# Patient Record
Sex: Female | Born: 1995 | Race: White | Hispanic: No | Marital: Single | Smoking: Never smoker
Health system: Southern US, Community
[De-identification: ages and names within clinical notes are randomized; demographics above are authoritative.]

## PROBLEM LIST (undated history)

## (undated) HISTORY — PX: HIP SURGERY: SHX245

---
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
## 2016-05-07 ENCOUNTER — Ambulatory Visit (INDEPENDENT_AMBULATORY_CARE_PROVIDER_SITE_OTHER): Payer: 59 | Admitting: Family Medicine

## 2016-05-07 ENCOUNTER — Encounter: Payer: Self-pay | Admitting: Family Medicine

## 2016-05-07 DIAGNOSIS — M25552 Pain in left hip: Secondary | ICD-10-CM | POA: Diagnosis not present

## 2016-05-07 MED ORDER — NAPROXEN 500 MG PO TABS
500.0000 mg | ORAL_TABLET | Freq: Two times a day (BID) | ORAL | 0 refills | Status: AC
Start: 2016-05-07 — End: ?

## 2016-05-07 NOTE — Progress Notes (Signed)
Patient presents today with left hip pain that she's had for a few weeks. Patient denies any particular injury or trauma to the hip. Patient did have hip surgery for a labral tear last year. Patient states that she was cleared for athletic activity last December. She did have some soreness when she returned back to physical activity but not similar to what she experienced before her surgery. She states the symptoms that she is having now are similar to what she had prior to her hip surgery. Patient states that she is having deep left hip pain with popping at times. She does feel the pain at rest sometimes. She did not been taking medications for his symptoms. She can walk without any significant pain.  ROS: Negative except mentioned above. Vitals as per Epic. GENERAL: NAD RESP: CTA B CARD: RRR MSK: No obvious tenderness to palpation of hip or back, full range of motion of both hips, mild discomfort with internal rotation of the left hip, no significant tightness of hip flexors, week glutes, negative straight leg raise, negative Fulcrum, NV intact NEURO: CN II-XII grossly intact   A/P: Left hip pain with history of labral tear status post surgery last year - discussed with patient that would recommend taking NSAID and modifying activity as needed. There are likely only 1-2 games left in the lacrosse season. If she has worsening pain she will follow-up with me and be taken out of activity. Will do imaging at that time if needed. I have recommended that if she continues to have discomfort with activity over summer break she should follow-up with her surgeon at home. Mother was on the phone during our appointment. Parent and patient are comfortable with the plan.Will discuss plan with trainer as well.

## 2017-11-01 ENCOUNTER — Other Ambulatory Visit: Payer: Self-pay

## 2017-11-01 ENCOUNTER — Encounter: Payer: Self-pay | Admitting: Emergency Medicine

## 2017-11-01 ENCOUNTER — Emergency Department
Admission: EM | Admit: 2017-11-01 | Discharge: 2017-11-01 | Disposition: A | Discharge status: Home / Self Care | Payer: Managed Care, Other (non HMO) | Attending: Emergency Medicine | Admitting: Emergency Medicine

## 2017-11-01 ENCOUNTER — Emergency Department: Payer: Managed Care, Other (non HMO)

## 2017-11-01 DIAGNOSIS — Z79899 Other long term (current) drug therapy: Secondary | ICD-10-CM | POA: Insufficient documentation

## 2017-11-01 DIAGNOSIS — R1031 Right lower quadrant pain: Secondary | ICD-10-CM | POA: Diagnosis present

## 2017-11-01 LAB — COMPREHENSIVE METABOLIC PANEL
ALT: 28 U/L (ref 0–44)
ANION GAP: 10 (ref 5–15)
AST: 33 U/L (ref 15–41)
Albumin: 4 g/dL (ref 3.5–5.0)
Alkaline Phosphatase: 36 U/L — ABNORMAL LOW (ref 38–126)
BUN: 14 mg/dL (ref 6–20)
CALCIUM: 9.3 mg/dL (ref 8.9–10.3)
CHLORIDE: 106 mmol/L (ref 98–111)
CO2: 23 mmol/L (ref 22–32)
CREATININE: 0.74 mg/dL (ref 0.44–1.00)
GFR calc non Af Amer: >60 mL/min (ref >60)
Glucose, Bld: 99 mg/dL (ref 70–99)
Potassium: 4.1 mmol/L (ref 3.5–5.1)
SODIUM: 139 mmol/L (ref 135–145)
Total Bilirubin: 0.6 mg/dL (ref 0.3–1.2)
Total Protein: 7.3 g/dL (ref 6.5–8.1)

## 2017-11-01 LAB — CBC
HEMATOCRIT: 40.9 % (ref 36.0–46.0)
Hemoglobin: 13.8 g/dL (ref 12.0–15.0)
MCH: 30.9 pg (ref 26.0–34.0)
MCHC: 33.7 g/dL (ref 30.0–36.0)
MCV: 91.5 fL (ref 80.0–100.0)
NRBC: 0 % (ref 0.0–0.2)
PLATELETS: 335 10*3/uL (ref 150–400)
RBC: 4.47 MIL/uL (ref 3.87–5.11)
RDW: 12.2 % (ref 11.5–15.5)
WBC: 7.5 10*3/uL (ref 4.0–10.5)

## 2017-11-01 LAB — LIPASE, BLOOD: Lipase: 22 U/L (ref 11–51)

## 2017-11-01 LAB — POCT PREGNANCY, URINE: Preg Test, Ur: NEGATIVE

## 2017-11-01 MED ORDER — ONDANSETRON 4 MG PO TBDP: 4.0000 mg | ORAL_TABLET | Freq: Three times a day (TID) | ORAL | 0 refills | Status: AC | PRN

## 2017-11-01 MED ORDER — IBUPROFEN 200 MG PO TABS: 600.0000 mg | ORAL_TABLET | Freq: Four times a day (QID) | ORAL | 0 refills | Status: AC | PRN

## 2017-11-01 MED ORDER — KETOROLAC TROMETHAMINE 30 MG/ML IJ SOLN
15.0000 mg | INTRAMUSCULAR | Status: AC
  Administered 2017-11-01: 15 mg via INTRAVENOUS
  Filled 2017-11-01: qty 1

## 2017-11-01 MED ORDER — IOPAMIDOL (ISOVUE-300) INJECTION 61%
100.0000 mL | Freq: Once | INTRAVENOUS | Status: AC | PRN
  Administered 2017-11-01: 100 mL via INTRAVENOUS

## 2017-11-01 MED ORDER — ONDANSETRON HCL 4 MG/2ML IJ SOLN
4.0000 mg | Freq: Once | INTRAMUSCULAR | Status: AC
  Administered 2017-11-01: 4 mg via INTRAVENOUS
  Filled 2017-11-01: qty 2

## 2017-11-01 MED ORDER — SODIUM CHLORIDE 0.9 % IV BOLUS
1000.0000 mL | Freq: Once | INTRAVENOUS | Status: AC
  Administered 2017-11-01: 1000 mL via INTRAVENOUS

## 2017-11-01 NOTE — ED Triage Notes (Signed)
Pt c/o right lower quadrant pain over the weekend, N,V,D since Sunday, appears in NAD. Possible fever last night.

## 2017-11-01 NOTE — ED Triage Notes (Signed)
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

## 2017-11-01 NOTE — ED Notes (Signed)
Patient transported to CT 

## 2017-11-01 NOTE — Discharge Instructions (Signed)
Your labs and CT scan of the abdomen were normal today.  Take anti-inflammatory medicine for pain, Zofran for nausea control, stay well-hydrated, and follow-up with your doctor later this week to continue monitoring your symptoms.

## 2017-11-01 NOTE — ED Provider Notes (Signed)
[facility] Emergency Department Provider Note  ____________________________________________  Time seen: Approximately 4:02 PM  I have reviewed the triage vital signs and the nursing notes.   HISTORY  Chief Complaint Abdominal Pain    HPI Anne Powell is a 22 y.o. female with no significant past medical history who complains of right lower quadrant abdominal pain that started 3 days ago, first waxing and waning, nonradiating, mild intensity, but since last night has become more intense, associated with chills.  Constant.  Worse with movement, no alleviating factors.  Still nonradiating.  Denies dysuria frequency urgency vaginal bleeding, vaginal discharge, or trauma.  Also reports some intermittent nausea vomiting and diarrhea.  Not positional.  Not suddenly colicky.  Feels like a moderate intensity ache.      History reviewed. No pertinent past medical history.   There are no active problems to display for this patient.    Past Surgical History:  Procedure Laterality Date  . HIP SURGERY       Prior to Admission medications   Medication Sig Start Date End Date Taking? Authorizing Provider  ibuprofen (MOTRIN IB) 200 MG tablet Take 3 tablets (600 mg total) by mouth every 6 (six) hours as needed. 11/01/17   Sharman Cheek, MD  naproxen (NAPROSYN) 500 MG tablet Take 1 tablet (500 mg total) by mouth 2 (two) times daily with a meal. 05/07/16   Jolene Provost, MD  ondansetron (ZOFRAN ODT) 4 MG disintegrating tablet Take 1 tablet (4 mg total) by mouth every 8 (eight) hours as needed for nausea or vomiting. 11/01/17   Sharman Cheek, MD     Allergies Penicillins   No family history on file.  Social History Social History   Tobacco Use  . Smoking status: Never Smoker  . Smokeless tobacco: Never Used  Substance Use Topics  . Alcohol use: Yes    Comment: occas.   . Drug use: Not on file    Review of Systems  Constitutional:   No fever  positive chills.  ENT:   No sore throat. No rhinorrhea. Cardiovascular:   No chest pain or syncope. Respiratory:   No dyspnea or cough. Gastrointestinal:   Positive for abdominal pain vomiting and diarrhea.  Musculoskeletal:   Negative for focal pain or swelling All other systems reviewed and are negative except as documented above in ROS and HPI.  ____________________________________________   PHYSICAL EXAM:  VITAL SIGNS: ED Triage Vitals  Enc Vitals Group     BP 11/01/17 1036 (!) 142/93     Pulse Rate 11/01/17 1036 (!) 101     Resp 11/01/17 1036 18     Temp 11/01/17 1036 98.4 F (36.9 C)     Temp Source 11/01/17 1036 Oral     SpO2 11/01/17 1036 98 %     Weight 11/01/17 1050 187 lb (84.8 kg)     Height 11/01/17 1050 5\' 5"  (1.651 m)     Head Circumference --      Peak Flow --      Pain Score 11/01/17 1049 6     Pain Loc --      Pain Edu? --      Excl. in GC? --     Vital signs reviewed, nursing assessments reviewed.   Constitutional:   Alert and oriented. Non-toxic appearance. Eyes:   Conjunctivae are normal. EOMI. PERRL. ENT      Head:   Normocephalic and atraumatic.      Nose:   No congestion/rhinnorhea.  Mouth/Throat:   MMM, no pharyngeal erythema. No peritonsillar mass.       Neck:   No meningismus. Full ROM. Hematological/Lymphatic/Immunilogical:   No cervical lymphadenopathy. Cardiovascular:   RRR. Symmetric bilateral radial and DP pulses.  No murmurs. Cap refill less than 2 seconds. Respiratory:   Normal respiratory effort without tachypnea/retractions. Breath sounds are clear and equal bilaterally. No wheezes/rales/rhonchi. Gastrointestinal:   Soft with right lower quadrant tenderness, not specifically localized to McBurney's point. Non distended. There is no CVA tenderness.  No rebound, rigidity, or guarding.  Musculoskeletal:   Normal range of motion in all extremities. No joint effusions.  No lower extremity tenderness.  No edema. Neurologic:   Normal  speech and language.  Motor grossly intact. No acute focal neurologic deficits are appreciated.  Skin:    Skin is warm, dry and intact. No rash noted.  No petechiae, purpura, or bullae.  ____________________________________________    LABS (pertinent positives/negatives) (all labs ordered are listed, but only abnormal results are displayed) Labs Reviewed  COMPREHENSIVE METABOLIC PANEL - Abnormal; Notable for the following components:      Result Value   Alkaline Phosphatase 36 (*)    All other components within normal limits  LIPASE, BLOOD  CBC  URINALYSIS, COMPLETE (UACMP) WITH MICROSCOPIC  POCT PREGNANCY, URINE  POC URINE PREG, ED   ____________________________________________   EKG    ____________________________________________    RADIOLOGY  Ct Abdomen Pelvis W Contrast  Result Date: 11/01/2017 CLINICAL DATA:  Lower abdominal pain with nausea and vomiting EXAM: CT ABDOMEN AND PELVIS WITH CONTRAST TECHNIQUE: Multidetector CT imaging of the abdomen and pelvis was performed using the standard protocol following bolus administration of intravenous contrast. CONTRAST:  ISOVUE-300 IOPAMIDOL (ISOVUE-300) INJECTION 61% COMPARISON:  None. FINDINGS: Lower chest: Lung bases are clear. Hepatobiliary: There is hepatic steatosis. No focal liver lesions are appreciable. Gallbladder wall is not appreciably thickened. There is no biliary duct dilatation. Pancreas: There is no evident pancreatic mass or inflammatory focus. Spleen: No splenic lesions are evident. Adrenals/Urinary Tract: Adrenals bilaterally appear unremarkable. Kidneys bilaterally show no evident mass or hydronephrosis on either side. There is no evident renal or ureteral calculus on either side. Urinary bladder is midline with wall thickness within normal limits. Stomach/Bowel: There is no appreciable bowel wall or mesenteric thickening. There is no evident bowel obstruction. There is no free air or portal venous air.  Vascular/Lymphatic: There is no abdominal aortic aneurysm. No vascular lesions are demonstrable on this study. No adenopathy is appreciable in the abdomen or pelvis. Reproductive: Uterus is anteverted.  No evident pelvic mass. Other: Appendix appears normal. There is no periappendiceal region inflammation. There is no abscess or ascites in the abdomen or pelvis. Musculoskeletal: There are no blastic or lytic bone lesions. There is no intramuscular or abdominal wall lesion evident. IMPRESSION: 1. A cause for patient's symptoms has not been established this study. 2. Appendix appears normal. No bowel wall thickening or bowel obstruction evident. No evident abscess in the abdomen or pelvis. 3.  No renal or ureteral calculus.  No hydronephrosis. 4.  There is hepatic steatosis. Electronically Signed   By: Bretta Bang III M.D.   On: 11/01/2017 14:54    ____________________________________________   PROCEDURES Procedures  ____________________________________________  DIFFERENTIAL DIAGNOSIS   Appendicitis, ovarian cyst, enteritis.  Doubt STI PID TOA torsion.  Not pregnant.  CLINICAL IMPRESSION / ASSESSMENT AND PLAN / ED COURSE  Pertinent labs & imaging results that were available during my care of the patient  were reviewed by me and considered in my medical decision making (see chart for details).    Patient presents with right lower quadrant abdominal pain.  Labs were unremarkable, vital signs are unremarkable.  CT obtained to rule out appendicitis which is negative.  Abdomen is nonsurgical.  Stable for discharge home to follow-up with primary care.  Most likely viral illness such as neurovirus which is currently prevalent in the community.  Zofran and ibuprofen for symptom relief.      ____________________________________________   FINAL CLINICAL IMPRESSION(S) / ED DIAGNOSES    Final diagnoses:  Right lower quadrant abdominal pain     ED Discharge Orders         Ordered     ondansetron (ZOFRAN ODT) 4 MG disintegrating tablet  Every 8 hours PRN     11/01/17 1601    ibuprofen (MOTRIN IB) 200 MG tablet  Every 6 hours PRN     11/01/17 1601          Portions of this note were generated with dragon dictation software. Dictation errors may occur despite best attempts at proofreading.    Sharman Cheek, MD 11/01/17 [phone]

## 2017-11-01 NOTE — ED Notes (Signed)
Pt ambulatory to POV without difficulty. VSS. NAD. Discharge instructions, RX and follow up reviewed. All questions and concerns addressed.  

## 2019-08-24 IMAGING — CT CT ABD-PELV W/ CM
2 of 4 series · 16 of 46 positions shown, 18 images · IV contrast (iopamidol)
Comparison: None.

CLINICAL DATA: Lower abdominal pain with nausea and vomiting

EXAM:
CT ABDOMEN AND PELVIS WITH CONTRAST
TECHNIQUE: Multidetector CT imaging of the abdomen and pelvis was performed
using the standard protocol following bolus administration of
intravenous contrast.
CONTRAST:  100mL ESY7RH-ITT IOPAMIDOL (ESY7RH-ITT) INJECTION 61%

[Series 2: axial st · axial · 0.85mm/px · z∈[-1017,-507]mm · 13 of 112 slices shown, 15 images]
[im 5/112  soft-tissue]
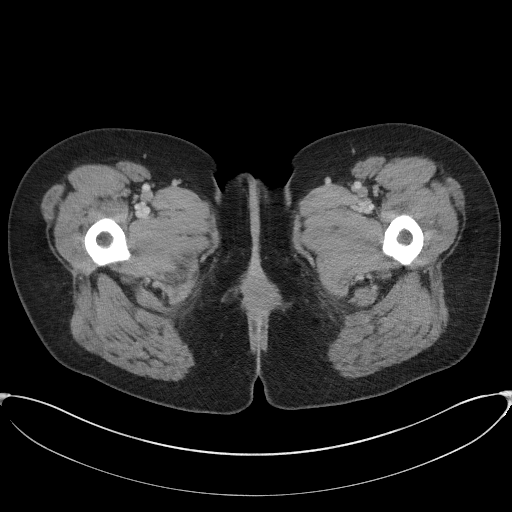
[im 5/112  bone]
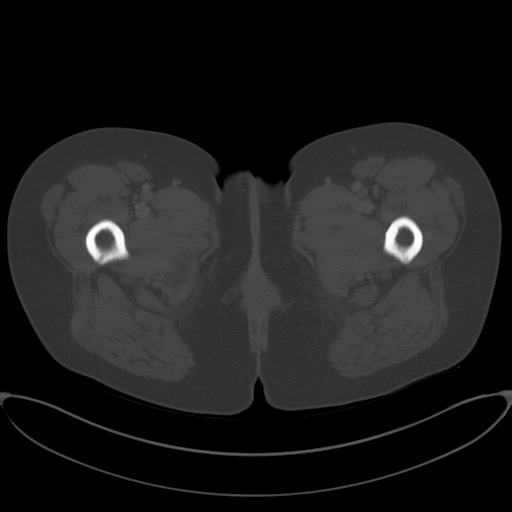
[im 15/112  soft-tissue]
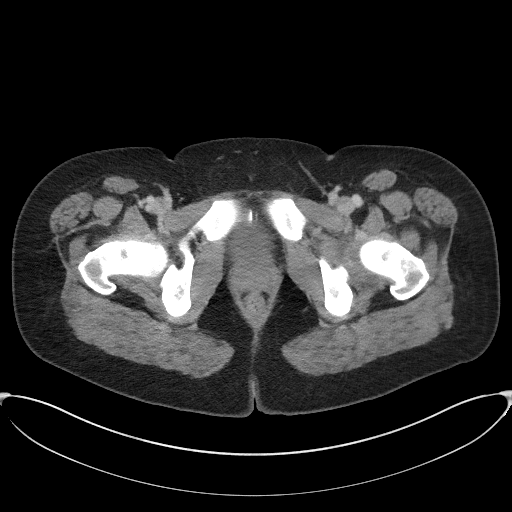
[im 25/112  soft-tissue]
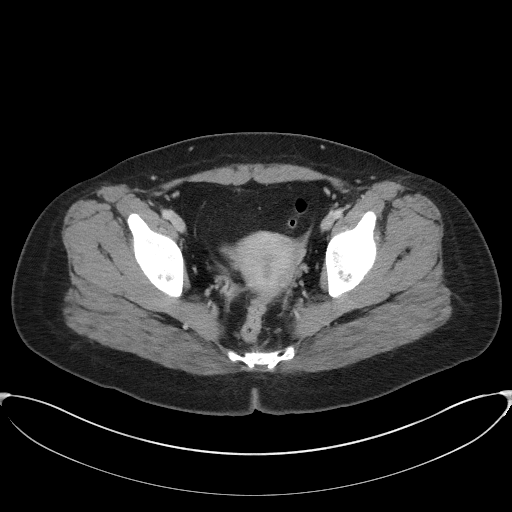
[im 29/112  soft-tissue]
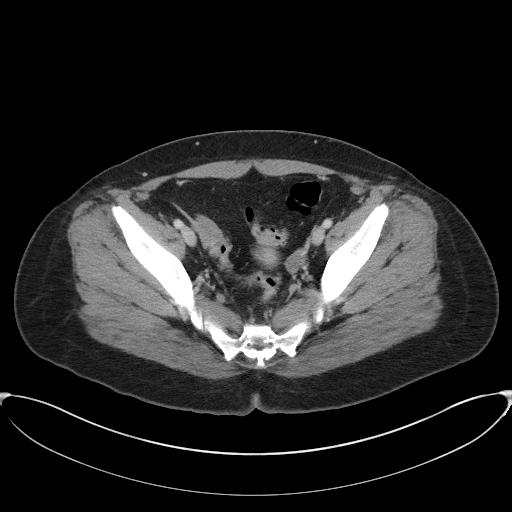
[im 39/112  soft-tissue]
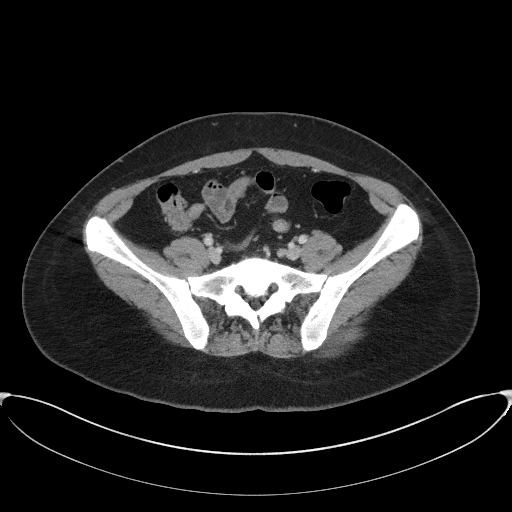
[im 49/112  soft-tissue]
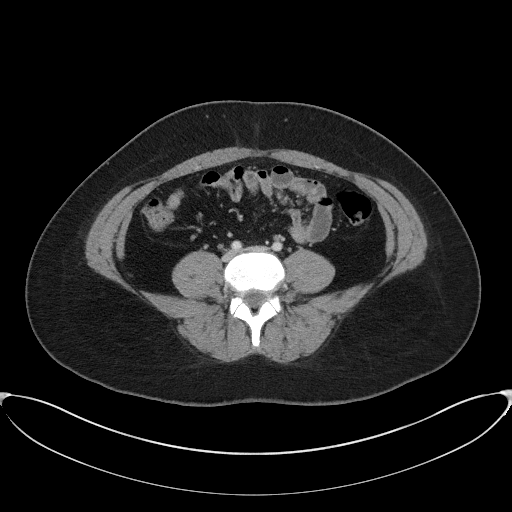
[im 58/112  soft-tissue]
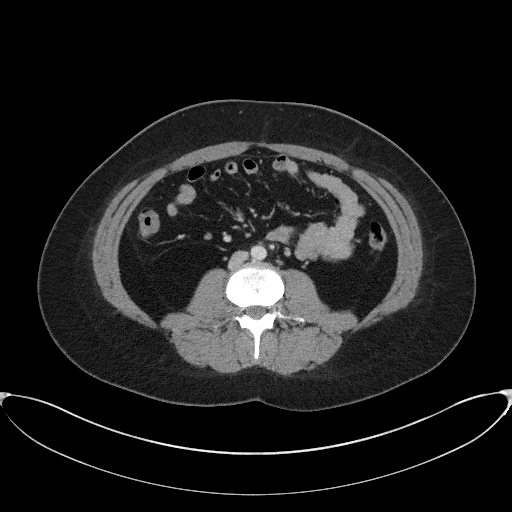
[im 63/112  soft-tissue]
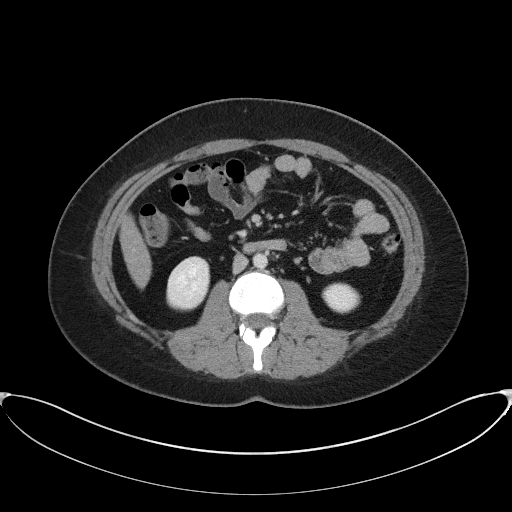
[im 73/112  soft-tissue]
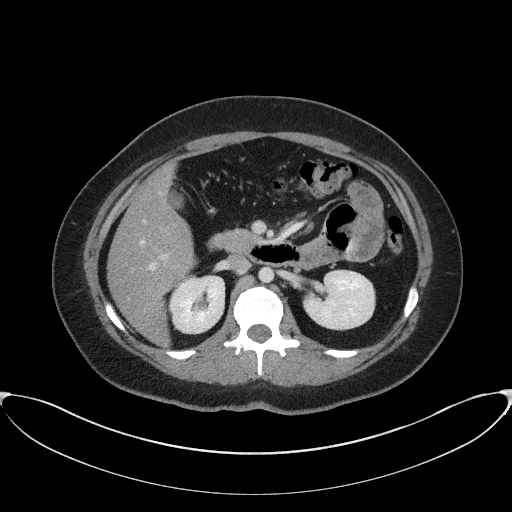
[im 73/112  bone]
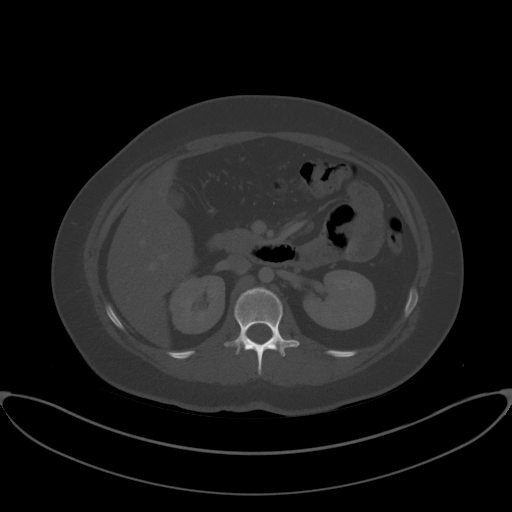
[im 83/112  soft-tissue]
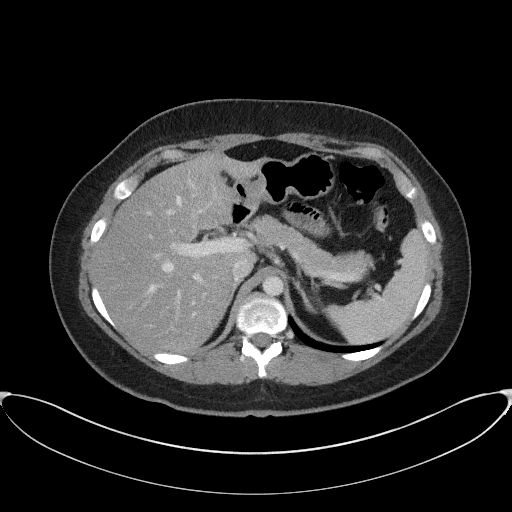
[im 87/112  soft-tissue]
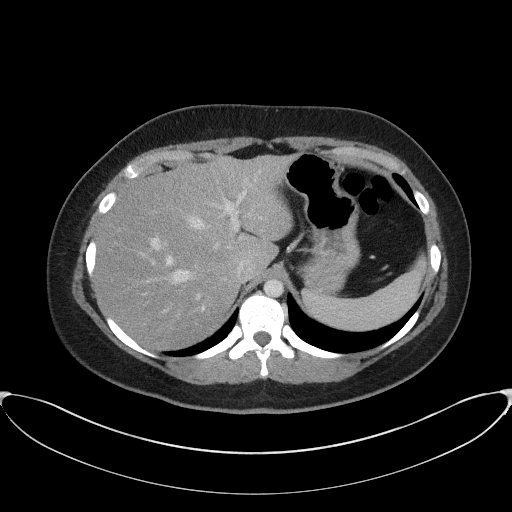
[im 97/112  soft-tissue]
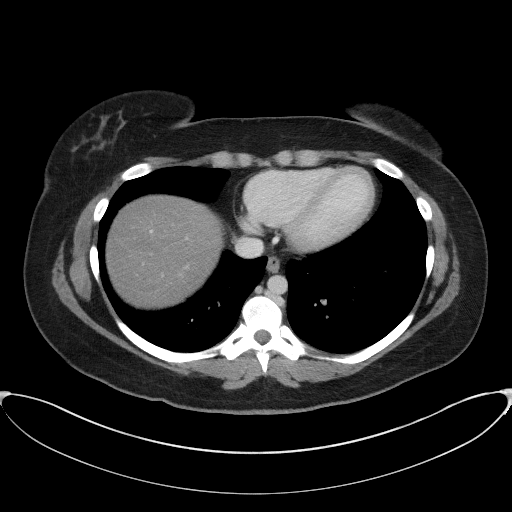
[im 107/112  soft-tissue]
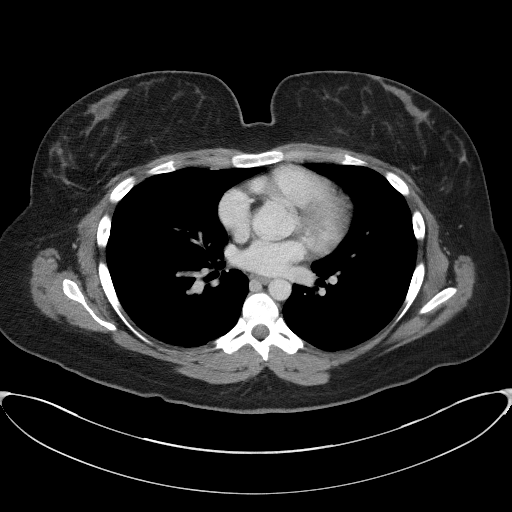

[Series 5: coronal st · coronal · 0.90mm/px · 3 of 89 slices shown]
[im 30/89  soft-tissue]
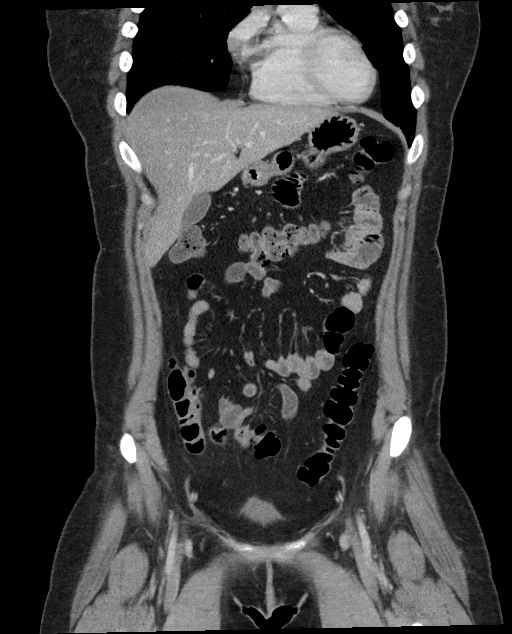
[im 40/89  soft-tissue]
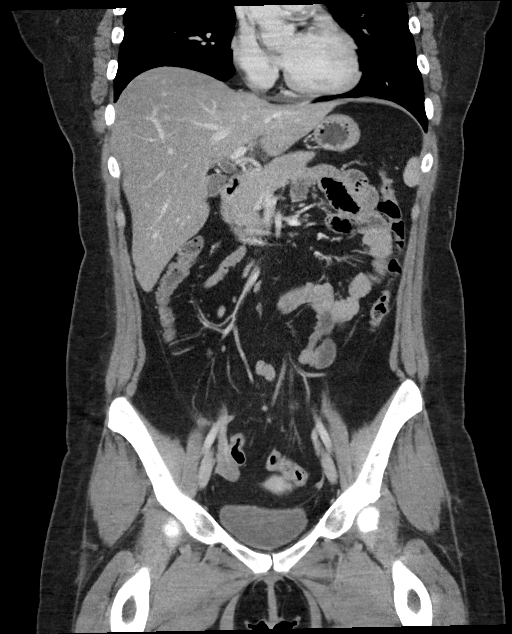
[im 49/89  soft-tissue]
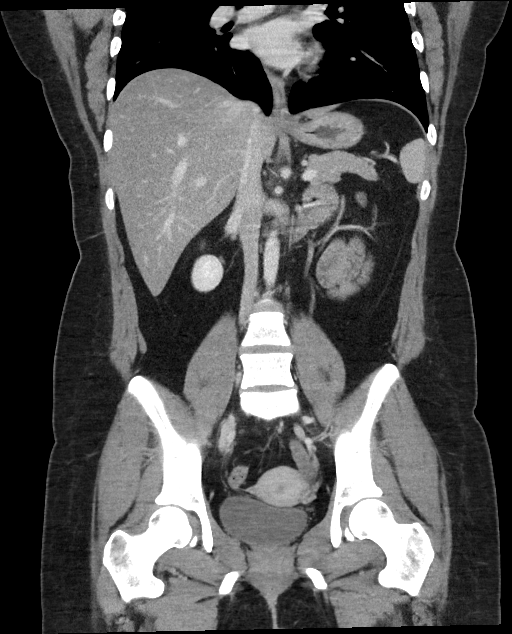

[16 of 46 positions shown; findings below may reference images not displayed]

FINDINGS: Lower chest: Lung bases are clear.

Hepatobiliary: There is hepatic steatosis. No focal liver lesions
are appreciable. Gallbladder wall is not appreciably thickened.
There is no biliary duct dilatation.

Pancreas: There is no evident pancreatic mass or inflammatory focus.

Spleen: No splenic lesions are evident.

Adrenals/Urinary Tract: Adrenals bilaterally appear unremarkable.
Kidneys bilaterally show no evident mass or hydronephrosis on either
side. There is no evident renal or ureteral calculus on either side.
Urinary bladder is midline with wall thickness within normal limits.

Stomach/Bowel: There is no appreciable bowel wall or mesenteric
thickening. There is no evident bowel obstruction. There is no free
air or portal venous air.

Vascular/Lymphatic: There is no abdominal aortic aneurysm. No
vascular lesions are demonstrable on this study. No adenopathy is
appreciable in the abdomen or pelvis.

Reproductive: Uterus is anteverted.  No evident pelvic mass.

Other: Appendix appears normal. There is no periappendiceal region
inflammation. There is no abscess or ascites in the abdomen or
pelvis.

Musculoskeletal: There are no blastic or lytic bone lesions. There
is no intramuscular or abdominal wall lesion evident.
IMPRESSION: 1. A cause for patient's symptoms has not been established this
study.

2. Appendix appears normal. No bowel wall thickening or bowel
obstruction evident. No evident abscess in the abdomen or pelvis.

3.  No renal or ureteral calculus.  No hydronephrosis.

4.  There is hepatic steatosis.
